# Patient Record
Sex: Male | Born: 1980 | Race: Black or African American | Hispanic: No | Marital: Single | Smoking: Current every day smoker
Health system: Southern US, Community
[De-identification: ages and names within clinical notes are randomized; demographics above are authoritative.]

---
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
## 2004-02-08 ENCOUNTER — Emergency Department (HOSPITAL_COMMUNITY): Admission: EM | Admit: 2004-02-08 | Discharge: 2004-02-08 | Payer: Self-pay | Admitting: Emergency Medicine

## 2010-06-17 ENCOUNTER — Emergency Department (HOSPITAL_COMMUNITY)
Admission: EM | Admit: 2010-06-17 | Discharge: 2010-06-18 | Disposition: A | Discharge status: Home / Self Care | Payer: Self-pay | Attending: Emergency Medicine | Admitting: Emergency Medicine

## 2010-06-17 ENCOUNTER — Emergency Department (HOSPITAL_COMMUNITY): Payer: Self-pay

## 2010-06-17 DIAGNOSIS — M549 Dorsalgia, unspecified: Secondary | ICD-10-CM | POA: Insufficient documentation

## 2011-09-22 ENCOUNTER — Emergency Department (HOSPITAL_COMMUNITY)
Admission: EM | Admit: 2011-09-22 | Discharge: 2011-09-22 | Disposition: A | Discharge status: Home / Self Care | Payer: No Typology Code available for payment source | Attending: Emergency Medicine | Admitting: Emergency Medicine

## 2011-09-22 ENCOUNTER — Encounter (HOSPITAL_COMMUNITY): Payer: Self-pay | Admitting: Emergency Medicine

## 2011-09-22 ENCOUNTER — Emergency Department (HOSPITAL_COMMUNITY): Payer: No Typology Code available for payment source

## 2011-09-22 DIAGNOSIS — IMO0001 Reserved for inherently not codable concepts without codable children: Secondary | ICD-10-CM | POA: Insufficient documentation

## 2011-09-22 DIAGNOSIS — T1490XA Injury, unspecified, initial encounter: Secondary | ICD-10-CM | POA: Insufficient documentation

## 2011-09-22 DIAGNOSIS — M545 Low back pain, unspecified: Secondary | ICD-10-CM | POA: Insufficient documentation

## 2011-09-22 DIAGNOSIS — M542 Cervicalgia: Secondary | ICD-10-CM | POA: Insufficient documentation

## 2011-09-22 MED ORDER — DIAZEPAM 5 MG PO TABS: 5.0000 mg | ORAL_TABLET | Freq: Three times a day (TID) | ORAL | Status: AC | PRN

## 2011-09-22 MED ORDER — IBUPROFEN 200 MG PO TABS
600.0000 mg | ORAL_TABLET | Freq: Once | ORAL | Status: AC
  Administered 2011-09-22: 600 mg via ORAL
  Filled 2011-09-22: qty 3

## 2011-09-22 MED ORDER — HYDROCODONE-ACETAMINOPHEN 5-325 MG PO TABS: 1.0000 | ORAL_TABLET | Freq: Four times a day (QID) | ORAL | Status: AC | PRN

## 2011-09-22 MED ORDER — IBUPROFEN 800 MG PO TABS: 800.0000 mg | ORAL_TABLET | Freq: Three times a day (TID) | ORAL | Status: AC | PRN
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
## 2011-09-22 NOTE — ED Provider Notes (Signed)
Medical screening examination/treatment/procedure(s) were performed by non-physician practitioner and as supervising physician I was immediately available for consultation/collaboration.  Ethelda Chick, MD 09/22/11 [phone]
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
## 2013-06-03 ENCOUNTER — Emergency Department (HOSPITAL_COMMUNITY): Payer: No Typology Code available for payment source

## 2013-06-03 ENCOUNTER — Encounter (HOSPITAL_COMMUNITY): Payer: Self-pay | Admitting: Emergency Medicine

## 2013-06-03 ENCOUNTER — Emergency Department (HOSPITAL_COMMUNITY)
Admission: EM | Admit: 2013-06-03 | Discharge: 2013-06-03 | Disposition: A | Discharge status: Home / Self Care | Payer: No Typology Code available for payment source | Attending: Emergency Medicine | Admitting: Emergency Medicine

## 2013-06-03 DIAGNOSIS — S335XXA Sprain of ligaments of lumbar spine, initial encounter: Secondary | ICD-10-CM | POA: Insufficient documentation

## 2013-06-03 DIAGNOSIS — S8990XA Unspecified injury of unspecified lower leg, initial encounter: Secondary | ICD-10-CM | POA: Insufficient documentation

## 2013-06-03 DIAGNOSIS — Y92410 Unspecified street and highway as the place of occurrence of the external cause: Secondary | ICD-10-CM | POA: Insufficient documentation

## 2013-06-03 DIAGNOSIS — S99919A Unspecified injury of unspecified ankle, initial encounter: Secondary | ICD-10-CM

## 2013-06-03 DIAGNOSIS — F17200 Nicotine dependence, unspecified, uncomplicated: Secondary | ICD-10-CM | POA: Insufficient documentation

## 2013-06-03 DIAGNOSIS — Y9389 Activity, other specified: Secondary | ICD-10-CM | POA: Insufficient documentation

## 2013-06-03 DIAGNOSIS — S39012A Strain of muscle, fascia and tendon of lower back, initial encounter: Secondary | ICD-10-CM

## 2013-06-03 DIAGNOSIS — S99929A Unspecified injury of unspecified foot, initial encounter: Secondary | ICD-10-CM

## 2013-06-03 MED ORDER — IBUPROFEN 600 MG PO TABS: 600.0000 mg | ORAL_TABLET | Freq: Four times a day (QID) | ORAL | Status: DC | PRN

## 2013-06-03 MED ORDER — CYCLOBENZAPRINE HCL 5 MG PO TABS: 5.0000 mg | ORAL_TABLET | Freq: Three times a day (TID) | ORAL | Status: DC | PRN
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
## 2013-06-03 NOTE — ED Provider Notes (Signed)
  Medical screening examination/treatment/procedure(s) were performed by non-physician practitioner and as supervising physician I was immediately available for consultation/collaboration.      Gerhard Munchobert Jakaylee Sasaki, MD 06/03/13 1539

## 2013-07-21 ENCOUNTER — Emergency Department (HOSPITAL_COMMUNITY)
Admission: EM | Admit: 2013-07-21 | Discharge: 2013-07-21 | Disposition: A | Discharge status: Home / Self Care | Payer: No Typology Code available for payment source | Attending: Emergency Medicine | Admitting: Emergency Medicine

## 2013-07-21 ENCOUNTER — Encounter (HOSPITAL_COMMUNITY): Payer: Self-pay | Admitting: Emergency Medicine

## 2013-07-21 DIAGNOSIS — F17200 Nicotine dependence, unspecified, uncomplicated: Secondary | ICD-10-CM | POA: Insufficient documentation

## 2013-07-21 DIAGNOSIS — M5430 Sciatica, unspecified side: Secondary | ICD-10-CM

## 2013-07-21 DIAGNOSIS — Z87828 Personal history of other (healed) physical injury and trauma: Secondary | ICD-10-CM | POA: Insufficient documentation

## 2013-07-21 MED ORDER — CYCLOBENZAPRINE HCL 10 MG PO TABS
10.0000 mg | ORAL_TABLET | Freq: Once | ORAL | Status: AC
  Administered 2013-07-21: 10 mg via ORAL
  Filled 2013-07-21: qty 1

## 2013-07-21 MED ORDER — CYCLOBENZAPRINE HCL 5 MG PO TABS: 5.0000 mg | ORAL_TABLET | Freq: Three times a day (TID) | ORAL | Status: DC | PRN

## 2013-07-21 MED ORDER — PREDNISONE 10 MG PO TABS: ORAL_TABLET | ORAL | Status: DC

## 2013-07-21 MED ORDER — PREDNISONE 50 MG PO TABS
60.0000 mg | ORAL_TABLET | Freq: Once | ORAL | Status: AC
  Administered 2013-07-21: 60 mg via ORAL
  Filled 2013-07-21 (×2): qty 1

## 2013-07-21 MED ORDER — HYDROCODONE-ACETAMINOPHEN 5-325 MG PO TABS
1.0000 | ORAL_TABLET | Freq: Once | ORAL | Status: AC
  Administered 2013-07-21: 1 via ORAL
  Filled 2013-07-21: qty 1

## 2013-07-21 MED ORDER — HYDROCODONE-ACETAMINOPHEN 5-325 MG PO TABS: 1.0000 | ORAL_TABLET | ORAL | Status: DC | PRN

## 2013-07-21 NOTE — Discharge Instructions (Signed)
Sciatica with Rehab The sciatic nerve runs from the back down the leg and is responsible for sensation and control of the muscles in the back (posterior) side of the thigh, lower leg, and foot. Sciatica is a condition that is characterized by inflammation of this nerve.  SYMPTOMS   Signs of nerve damage, including numbness and/or weakness along the posterior side of the lower extremity.  Pain in the back of the thigh that may also travel down the leg.  Pain that worsens when sitting for long periods of time.  Occasionally, pain in the back or buttock. CAUSES  Inflammation of the sciatic nerve is the cause of sciatica. The inflammation is due to something irritating the nerve. Common sources of irritation include:  Sitting for long periods of time.  Direct trauma to the nerve.  Arthritis of the spine.  Herniated or ruptured disk.  Slipping of the vertebrae (spondylolithesis)  Pressure from soft tissues, such as muscles or ligament-like tissue (fascia). RISK INCREASES WITH:  Sports that place pressure or stress on the spine (football or weightlifting).  Poor strength and flexibility.  Failure to warm-up properly before activity.  Family history of low back pain or disk disorders.  Previous back injury or surgery.  Poor body mechanics, especially when lifting, or poor posture. PREVENTION   Warm up and stretch properly before activity.  Maintain physical fitness:  Strength, flexibility, and endurance.  Cardiovascular fitness.  Learn and use proper technique, especially with posture and lifting. When possible, have coach correct improper technique.  Avoid activities that place stress on the spine. PROGNOSIS If treated properly, then sciatica usually resolves within 6 weeks. However, occasionally surgery is necessary.  RELATED COMPLICATIONS   Permanent nerve damage, including pain, numbness, tingle, or weakness.  Chronic back pain.  Risks of surgery: infection,  bleeding, nerve damage, or damage to surrounding tissues. TREATMENT Treatment initially involves resting from any activities that aggravate your symptoms. The use of ice and medication may help reduce pain and inflammation. The use of strengthening and stretching exercises may help reduce pain with activity. These exercises may be performed at home or with referral to a therapist. A therapist may recommend further treatments, such as transcutaneous electronic nerve stimulation (TENS) or ultrasound. Your caregiver may recommend corticosteroid injections to help reduce inflammation of the sciatic nerve. If symptoms persist despite non-surgical (conservative) treatment, then surgery may be recommended. MEDICATION  If pain medication is necessary, then nonsteroidal anti-inflammatory medications, such as aspirin and ibuprofen, or other minor pain relievers, such as acetaminophen, are often recommended.  Do not take pain medication for 7 days before surgery.  Prescription pain relievers may be given if deemed necessary by your caregiver. Use only as directed and only as much as you need.  Ointments applied to the skin may be helpful.  Corticosteroid injections may be given by your caregiver. These injections should be reserved for the most serious cases, because they may only be given a certain number of times. HEAT AND COLD  Cold treatment (icing) relieves pain and reduces inflammation. Cold treatment should be applied for 10 to 15 minutes every 2 to 3 hours for inflammation and pain and immediately after any activity that aggravates your symptoms. Use ice packs or massage the area with a piece of ice (ice massage).  Heat treatment may be used prior to performing the stretching and strengthening activities prescribed by your caregiver, physical therapist, or athletic trainer. Use a heat pack or soak the injury in warm water. SEEK  MEDICAL CARE IF:  Treatment seems to offer no benefit, or the condition  worsens.  Any medications produce adverse side effects. EXERCISES  RANGE OF MOTION (ROM) AND STRETCHING EXERCISES - Sciatica Most people with sciatic will find that their symptoms worsen with either excessive bending forward (flexion) or arching at the low back (extension). The exercises which will help resolve your symptoms will focus on the opposite motion. Your physician, physical therapist or athletic trainer will help you determine which exercises will be most helpful to resolve your low back pain. Do not complete any exercises without first consulting with your clinician. Discontinue any exercises which worsen your symptoms until you speak to your clinician. If you have pain, numbness or tingling which travels down into your buttocks, leg or foot, the goal of the therapy is for these symptoms to move closer to your back and eventually resolve. Occasionally, these leg symptoms will get better, but your low back pain may worsen; this is typically an indication of progress in your rehabilitation. Be certain to be very alert to any changes in your symptoms and the activities in which you participated in the 24 hours prior to the change. Sharing this information with your clinician will allow him/her to most efficiently treat your condition. These exercises may help you when beginning to rehabilitate your injury. Your symptoms may resolve with or without further involvement from your physician, physical therapist or athletic trainer. While completing these exercises, remember:   Restoring tissue flexibility helps normal motion to return to the joints. This allows healthier, less painful movement and activity.  An effective stretch should be held for at least 30 seconds.  A stretch should never be painful. You should only feel a gentle lengthening or release in the stretched tissue. FLEXION RANGE OF MOTION AND STRETCHING EXERCISES: STRETCH  Flexion, Single Knee to Chest   Lie on a firm bed or floor  with both legs extended in front of you.  Keeping one leg in contact with the floor, bring your opposite knee to your chest. Hold your leg in place by either grabbing behind your thigh or at your knee.  Pull until you feel a gentle stretch in your low back. Hold __________ seconds.  Slowly release your grasp and repeat the exercise with the opposite side. Repeat __________ times. Complete this exercise __________ times per day.  STRETCH  Flexion, Double Knee to Chest  Lie on a firm bed or floor with both legs extended in front of you.  Keeping one leg in contact with the floor, bring your opposite knee to your chest.  Tense your stomach muscles to support your back and then lift your other knee to your chest. Hold your legs in place by either grabbing behind your thighs or at your knees.  Pull both knees toward your chest until you feel a gentle stretch in your low back. Hold __________ seconds.  Tense your stomach muscles and slowly return one leg at a time to the floor. Repeat __________ times. Complete this exercise __________ times per day.  STRETCH  Low Trunk Rotation   Lie on a firm bed or floor. Keeping your legs in front of you, bend your knees so they are both pointed toward the ceiling and your feet are flat on the floor.  Extend your arms out to the side. This will stabilize your upper body by keeping your shoulders in contact with the floor.  Gently and slowly drop both knees together to one side until you  feel a gentle stretch in your low back. Hold for __________ seconds.  Tense your stomach muscles to support your low back as you bring your knees back to the starting position. Repeat the exercise to the other side. Repeat __________ times. Complete this exercise __________ times per day  EXTENSION RANGE OF MOTION AND FLEXIBILITY EXERCISES: STRETCH  Extension, Prone on Elbows  Lie on your stomach on the floor, a bed will be too soft. Place your palms about shoulder  width apart and at the height of your head.  Place your elbows under your shoulders. If this is too painful, stack pillows under your chest.  Allow your body to relax so that your hips drop lower and make contact more completely with the floor.  Hold this position for __________ seconds.  Slowly return to lying flat on the floor. Repeat __________ times. Complete this exercise __________ times per day.  RANGE OF MOTION  Extension, Prone Press Ups  Lie on your stomach on the floor, a bed will be too soft. Place your palms about shoulder width apart and at the height of your head.  Keeping your back as relaxed as possible, slowly straighten your elbows while keeping your hips on the floor. You may adjust the placement of your hands to maximize your comfort. As you gain motion, your hands will come more underneath your shoulders.  Hold this position __________ seconds.  Slowly return to lying flat on the floor. Repeat __________ times. Complete this exercise __________ times per day.  STRENGTHENING EXERCISES - Sciatica  These exercises may help you when beginning to rehabilitate your injury. These exercises should be done near your "sweet spot." This is the neutral, low-back arch, somewhere between fully rounded and fully arched, that is your least painful position. When performed in this safe range of motion, these exercises can be used for people who have either a flexion or extension based injury. These exercises may resolve your symptoms with or without further involvement from your physician, physical therapist or athletic trainer. While completing these exercises, remember:   Muscles can gain both the endurance and the strength needed for everyday activities through controlled exercises.  Complete these exercises as instructed by your physician, physical therapist or athletic trainer. Progress with the resistance and repetition exercises only as your caregiver advises.  You may  experience muscle soreness or fatigue, but the pain or discomfort you are trying to eliminate should never worsen during these exercises. If this pain does worsen, stop and make certain you are following the directions exactly. If the pain is still present after adjustments, discontinue the exercise until you can discuss the trouble with your clinician. STRENGTHENING Deep Abdominals, Pelvic Tilt   Lie on a firm bed or floor. Keeping your legs in front of you, bend your knees so they are both pointed toward the ceiling and your feet are flat on the floor.  Tense your lower abdominal muscles to press your low back into the floor. This motion will rotate your pelvis so that your tail bone is scooping upwards rather than pointing at your feet or into the floor.  With a gentle tension and even breathing, hold this position for __________ seconds. Repeat __________ times. Complete this exercise __________ times per day.  STRENGTHENING  Abdominals, Crunches   Lie on a firm bed or floor. Keeping your legs in front of you, bend your knees so they are both pointed toward the ceiling and your feet are flat on the floor. [employer]  your arms over your chest.  Slightly tip your chin down without bending your neck.  Tense your abdominals and slowly lift your trunk high enough to just clear your shoulder blades. Lifting higher can put excessive stress on the low back and does not further strengthen your abdominal muscles.  Control your return to the starting position. Repeat __________ times. Complete this exercise __________ times per day.  STRENGTHENING  Quadruped, Opposite UE/LE Lift  Assume a hands and knees position on a firm surface. Keep your hands under your shoulders and your knees under your hips. You may place padding under your knees for comfort.  Find your neutral spine and gently tense your abdominal muscles so that you can maintain this position. Your shoulders and hips should form a rectangle that  is parallel with the floor and is not twisted.  Keeping your trunk steady, lift your right hand no higher than your shoulder and then your left leg no higher than your hip. Make sure you are not holding your breath. Hold this position __________ seconds.  Continuing to keep your abdominal muscles tense and your back steady, slowly return to your starting position. Repeat with the opposite arm and leg. Repeat __________ times. Complete this exercise __________ times per day.  STRENGTHENING  Abdominals and Quadriceps, Straight Leg Raise   Lie on a firm bed or floor with both legs extended in front of you.  Keeping one leg in contact with the floor, bend the other knee so that your foot can rest flat on the floor.  Find your neutral spine, and tense your abdominal muscles to maintain your spinal position throughout the exercise.  Slowly lift your straight leg off the floor about 6 inches for a count of 15, making sure to not hold your breath.  Still keeping your neutral spine, slowly lower your leg all the way to the floor. Repeat this exercise with each leg __________ times. Complete this exercise __________ times per day. POSTURE AND BODY MECHANICS CONSIDERATIONS - Sciatica Keeping correct posture when sitting, standing or completing your activities will reduce the stress put on different body tissues, allowing injured tissues a chance to heal and limiting painful experiences. The following are general guidelines for improved posture. Your physician or physical therapist will provide you with any instructions specific to your needs. While reading these guidelines, remember:  The exercises prescribed by your provider will help you have the flexibility and strength to maintain correct postures.  The correct posture provides the optimal environment for your joints to work. All of your joints have less wear and tear when properly supported by a spine with good posture. This means you will  experience a healthier, less painful body.  Correct posture must be practiced with all of your activities, especially prolonged sitting and standing. Correct posture is as important when doing repetitive low-stress activities (typing) as it is when doing a single heavy-load activity (lifting). RESTING POSITIONS Consider which positions are most painful for you when choosing a resting position. If you have pain with flexion-based activities (sitting, bending, stooping, squatting), choose a position that allows you to rest in a less flexed posture. You would want to avoid curling into a fetal position on your side. If your pain worsens with extension-based activities (prolonged standing, working overhead), avoid resting in an extended position such as sleeping on your stomach. Most people will find more comfort when they rest with their spine in a more neutral position, neither too rounded nor too arched. Lying  on a non-sagging bed on your side with a pillow between your knees, or on your back with a pillow under your knees will often provide some relief. Keep in mind, being in any one position for a prolonged period of time, no matter how correct your posture, can still lead to stiffness. PROPER SITTING POSTURE In order to minimize stress and discomfort on your spine, you must sit with correct posture Sitting with good posture should be effortless for a healthy body. Returning to good posture is a gradual process. Many people can work toward this most comfortably by using various supports until they have the flexibility and strength to maintain this posture on their own. When sitting with proper posture, your ears will fall over your shoulders and your shoulders will fall over your hips. You should use the back of the chair to support your upper back. Your low back will be in a neutral position, just slightly arched. You may place a small pillow or folded towel at the base of your low back for support.  When  working at a desk, create an environment that supports good, upright posture. Without extra support, muscles fatigue and lead to excessive strain on joints and other tissues. Keep these recommendations in mind: CHAIR:   A chair should be able to slide under your desk when your back makes contact with the back of the chair. This allows you to work closely.  The chair's height should allow your eyes to be level with the upper part of your monitor and your hands to be slightly lower than your elbows. BODY POSITION  Your feet should make contact with the floor. If this is not possible, use a foot rest.  Keep your ears over your shoulders. This will reduce stress on your neck and low back. INCORRECT SITTING POSTURES   If you are feeling tired and unable to assume a healthy sitting posture, do not slouch or slump. This puts excessive strain on your back tissues, causing more damage and pain. Healthier options include:  Using more support, like a lumbar pillow.  Switching tasks to something that requires you to be upright or walking.  Talking a brief walk.  Lying down to rest in a neutral-spine position. PROLONGED STANDING WHILE SLIGHTLY LEANING FORWARD  When completing a task that requires you to lean forward while standing in one place for a long time, place either foot up on a stationary 2-4 inch high object to help maintain the best posture. When both feet are on the ground, the low back tends to lose its slight inward curve. If this curve flattens (or becomes too large), then the back and your other joints will experience too much stress, fatigue more quickly and can cause pain.  CORRECT STANDING POSTURES Proper standing posture should be assumed with all daily activities, even if they only take a few moments, like when brushing your teeth. As in sitting, your ears should fall over your shoulders and your shoulders should fall over your hips. You should keep a slight tension in your abdominal  muscles to brace your spine. Your tailbone should point down to the ground, not behind your body, resulting in an over-extended swayback posture.  INCORRECT STANDING POSTURES  Common incorrect standing postures include a forward head, locked knees and/or an excessive swayback. WALKING Walk with an upright posture. Your ears, shoulders and hips should all line-up. PROLONGED ACTIVITY IN A FLEXED POSITION When completing a task that requires you to bend forward at your  waist or lean over a low surface, try to find a way to stabilize 3 of 4 of your limbs. You can place a hand or elbow on your thigh or rest a knee on the surface you are reaching across. This will provide you more stability so that your muscles do not fatigue as quickly. By keeping your knees relaxed, or slightly bent, you will also reduce stress across your low back. CORRECT LIFTING TECHNIQUES DO :   Assume a wide stance. This will provide you more stability and the opportunity to get as close as possible to the object which you are lifting.  Tense your abdominals to brace your spine; then bend at the knees and hips. Keeping your back locked in a neutral-spine position, lift using your leg muscles. Lift with your legs, keeping your back straight.  Test the weight of unknown objects before attempting to lift them.  Try to keep your elbows locked down at your sides in order get the best strength from your shoulders when carrying an object.  Always ask for help when lifting heavy or awkward objects. INCORRECT LIFTING TECHNIQUES DO NOT:   Lock your knees when lifting, even if it is a small object.  Bend and twist. Pivot at your feet or move your feet when needing to change directions.  Assume that you cannot safely pick up a paperclip without proper posture. Document Released: 04/20/2005 Document Revised: 07/13/2011 Document Reviewed: 08/02/2008 [facility] Patient Information 2014 [place], [place].   Take your next dose of  prednisone tomorrow morning.  Use the the other medicines as directed.  Do not drive within 4 hours of taking hydrocodone as this will make you drowsy.  Avoid lifting,  Bending,  Twisting or any other activity that worsens your pain over the next week.   You should get rechecked if your symptoms are not better over the next 5 days,  Or you develop increased pain,  Weakness in your leg(s) or loss of bladder or bowel function - these are symptoms of a worse injury.
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
## 2013-07-26 NOTE — ED Provider Notes (Signed)
Medical screening examination/treatment/procedure(s) were performed by non-physician practitioner and as supervising physician I was immediately available for consultation/collaboration.   EKG Interpretation None        Christopher J. Pollina, MD 07/26/13 0701 

## 2021-09-02 ENCOUNTER — Emergency Department (HOSPITAL_COMMUNITY): Payer: 59

## 2021-09-02 ENCOUNTER — Encounter (HOSPITAL_COMMUNITY): Payer: Self-pay | Admitting: Emergency Medicine

## 2021-09-02 ENCOUNTER — Other Ambulatory Visit: Payer: Self-pay

## 2021-09-02 ENCOUNTER — Emergency Department (HOSPITAL_COMMUNITY)
Admission: EM | Admit: 2021-09-02 | Discharge: 2021-09-02 | Disposition: A | Discharge status: Home / Self Care | Payer: 59 | Attending: Emergency Medicine | Admitting: Emergency Medicine

## 2021-09-02 DIAGNOSIS — M5440 Lumbago with sciatica, unspecified side: Secondary | ICD-10-CM | POA: Insufficient documentation

## 2021-09-02 DIAGNOSIS — M545 Low back pain, unspecified: Secondary | ICD-10-CM | POA: Diagnosis present

## 2021-09-02 MED ORDER — IBUPROFEN 800 MG PO TABS
800.0000 mg | ORAL_TABLET | Freq: Once | ORAL | Status: AC
  Administered 2021-09-02: 800 mg via ORAL
  Filled 2021-09-02: qty 1

## 2021-09-02 MED ORDER — IBUPROFEN 800 MG PO TABS: 800.0000 mg | ORAL_TABLET | Freq: Three times a day (TID) | ORAL | 1 refills | Status: DC | PRN

## 2021-09-02 NOTE — ED Notes (Signed)
Patient transported to X-ray 

## 2021-09-02 NOTE — ED Triage Notes (Signed)
Pt c/o lower right back pain that radiates down right leg since Sunday.  ?

## 2021-09-02 NOTE — Discharge Instructions (Signed)
Follow-up with Dr. Romeo Apple for your back.  Also get a family doctor.  Make sure your blood pressure check next couple weeks.  Try not to do any lifting over 10 pounds ?

## 2021-09-02 NOTE — ED Provider Notes (Signed)
?[facility] EMERGENCY DEPARTMENT ?Provider Note ? ? ?CSN: [id number] ?Arrival date & time: 09/02/21  0631 ? ?  ? ?History ? ?Chief Complaint  ?Patient presents with  ? Back Pain  ? ? ?Harry Woodard is a 41 y.o. male. ? ?Patient has no past medical history.  Patient complains of low back pain rating down her right leg. ? ?The history is provided by the patient and medical records. No language interpreter was used.  ?Back Pain ?Location:  Lumbar spine ?Quality:  Aching ?Radiates to:  R posterior upper leg ?Pain severity:  Moderate ?Onset quality:  Sudden ?Timing:  Constant ?Progression:  Worsening ?Chronicity:  New ?Context: not emotional stress   ?Relieved by:  Nothing ?Worsened by:  Nothing ?Ineffective treatments:  None tried ?Associated symptoms: no abdominal pain, no chest pain and no headaches   ? ?  ? ?Home Medications ?Prior to Admission medications   ?Medication Sig Start Date End Date Taking? Authorizing Provider  ?ibuprofen (ADVIL) 800 MG tablet Take 1 tablet (800 mg total) by mouth every 8 (eight) hours as needed for moderate pain. 09/02/21  Yes Bethann Berkshire, MD  ?cyclobenzaprine (FLEXERIL) 5 MG tablet Take 1 tablet (5 mg total) by mouth 3 (three) times daily as needed for muscle spasms. 06/03/13   Burgess Amor, PA-C  ?cyclobenzaprine (FLEXERIL) 5 MG tablet Take 1 tablet (5 mg total) by mouth 3 (three) times daily as needed for muscle spasms. 07/21/13   Burgess Amor, PA-C  ?HYDROcodone-acetaminophen (NORCO/VICODIN) 5-325 MG per tablet Take 1 tablet by mouth every 4 (four) hours as needed for moderate pain. 07/21/13   Burgess Amor, PA-C  ?predniSONE (DELTASONE) 10 MG tablet 6, 5, 4, 3, 2 then 1 tablet by mouth daily for 6 days total. 07/21/13   Burgess Amor, PA-C  ?   ? ?Allergies    ?Patient has no known allergies.   ? ?Review of Systems   ?Review of Systems  ?Constitutional:  Negative for appetite change and fatigue.  ?HENT:  Negative for congestion, ear discharge and sinus pressure.   ?Eyes:  Negative for  discharge.  ?Respiratory:  Negative for cough.   ?Cardiovascular:  Negative for chest pain.  ?Gastrointestinal:  Negative for abdominal pain and diarrhea.  ?Genitourinary:  Negative for frequency and hematuria.  ?Musculoskeletal:  Positive for back pain.  ?Skin:  Negative for rash.  ?Neurological:  Negative for seizures and headaches.  ?Psychiatric/Behavioral:  Negative for hallucinations.   ? ?Physical Exam ?Updated Vital Signs ?BP (!) 131/117   Pulse 62   Temp 97.9 ?F (36.6 ?C) (Oral)   Resp 16   Ht 5\' 6"  (1.676 m)   Wt 68 kg   SpO2 97%   BMI 24.21 kg/m?  ?Physical Exam ?Vitals and nursing note reviewed.  ?Constitutional:   ?   Appearance: He is well-developed.  ?HENT:  ?   Head: Normocephalic.  ?   Nose: Nose normal.  ?Eyes:  ?   General: No scleral icterus. ?   Conjunctiva/sclera: Conjunctivae normal.  ?Neck:  ?   Thyroid: No thyromegaly.  ?Cardiovascular:  ?   Rate and Rhythm: Normal rate and regular rhythm.  ?   Heart sounds: No murmur heard. ?  No friction rub. No gallop.  ?Pulmonary:  ?   Breath sounds: No stridor. No wheezing or rales.  ?Chest:  ?   Chest wall: No tenderness.  ?Abdominal:  ?   General: There is no distension.  ?   Tenderness: There is no abdominal tenderness.  There is no rebound.  ?Musculoskeletal:  ?   Cervical back: Neck supple.  ?   Comments: Tender lumbar spine  ?Lymphadenopathy:  ?   Cervical: No cervical adenopathy.  ?Skin: ?   Findings: No erythema or rash.  ?Neurological:  ?   Mental Status: He is alert and oriented to person, place, and time.  ?   Motor: No abnormal muscle tone.  ?   Coordination: Coordination normal.  ?Psychiatric:     ?   Behavior: Behavior normal.  ? ? ?ED Results / Procedures / Treatments   ?Labs ?(all labs ordered are listed, but only abnormal results are displayed) ?Labs Reviewed - No data to display ? ?EKG ?None ? ?Radiology ?DG Lumbar Spine Complete ? ?Result Date: 09/02/2021 ?CLINICAL DATA:  Right lower back pain radiating into the right leg since  Sunday EXAM: LUMBAR SPINE - COMPLETE 4+ VIEW COMPARISON:  06/03/2013 FINDINGS: There are 6 lumbar type vertebral bodies. There is pseudoarticulation between the right transverse process of L6 and the right sacral ala. No significant listhesis. Mild disc height loss and endplate spurring present at L6-S1, similar to prior study. Mild facet degenerative changes present at L5-L6 and L6-S1. 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 Electronically Signed   By: Acquanetta Belling M.D.   On: 09/02/2021 07:58   ? ?Procedures ?Procedures  ? ? ?Medications Ordered in ED ?Medications  ?ibuprofen (ADVIL) tablet 800 mg (800 mg Oral Given 09/02/21 0736)  ? ? ?ED Course/ Medical Decision Making/ A&P ?  ?                        ?Medical Decision Making ?Amount and/or Complexity of Data Reviewed ?Radiology: ordered. ? ?Risk ?Prescription drug management. ? ? ?This patient presents to the ED for concern of back pain, this involves an extensive number of treatment options, and is a complaint that carries with it a high risk of complications and morbidity.  The differential diagnosis includes muscle strain, disc disease ? ? ?Co morbidities that complicate the patient evaluation ? ?None ? ? ?Additional history obtained: ? ?Additional history obtained from patient ?External records from outside source obtained and reviewed including hospital record ? ? ?Lab Tests: ?No labs ?Imaging Studies ordered: ? ?I ordered imaging studies including lumbar spine series ?I independently visualized and interpreted imaging which showed degenerative changes ?I agree with the radiologist interpretation ? ? ?Cardiac Monitoring: / EKG: ? ?The patient was maintained on a cardiac monitor.  I personally viewed and interpreted the cardiac monitored which showed an underlying rhythm of: Normal sinus rhythm ? ? ?Consultations Obtained: ? ?No consult ? ?Problem List / ED Course / Critical interventions / Medication  management ? ?Back pain and ?I ordered medication including Motrin for pain ?Reevaluation of the patient after these medicines showed that the patient stayed the same ?I have reviewed the patients home medicines and have made adjustments as needed ? ? ?Social Determinants of Health: ? ?None ? ? ?Test / Admission - Considered: ? ?None ? ?Patient with lumbar strain and sciatica.  He also has mildly elevated blood pressure.  He is placed on Motrin for his back and will follow-up with the PCP and orthopedics.  He was instructed to get his blood pressure checked a couple weeks ? ? ? ? ? ? ? ?Final Clinical Impression(s) / ED Diagnoses ?Final diagnoses:  ?Acute right-sided low back pain with sciatica, sciatica laterality unspecified  ? ? ?  Rx / DC Orders ?ED Discharge Orders   ? ?      Ordered  ?  ibuprofen (ADVIL) 800 MG tablet  Every 8 hours PRN       ? 09/02/21 [id number]  ? ?  ?  ? ?  ? ? ?  ?Bethann BerkshireZammit, Von [place], MD ?09/02/21 1658 ? ?

## 2021-09-10 ENCOUNTER — Ambulatory Visit: Payer: 59 | Admitting: Family Medicine

## 2022-04-23 ENCOUNTER — Emergency Department (HOSPITAL_COMMUNITY)
Admission: EM | Admit: 2022-04-23 | Discharge: 2022-04-23 | Disposition: A | Discharge status: Home / Self Care | Payer: No Typology Code available for payment source | Attending: Emergency Medicine | Admitting: Emergency Medicine

## 2022-04-23 ENCOUNTER — Encounter (HOSPITAL_COMMUNITY): Payer: Self-pay | Admitting: *Deleted

## 2022-04-23 ENCOUNTER — Other Ambulatory Visit: Payer: Self-pay

## 2022-04-23 ENCOUNTER — Emergency Department (HOSPITAL_COMMUNITY): Payer: No Typology Code available for payment source

## 2022-04-23 DIAGNOSIS — Y92410 Unspecified street and highway as the place of occurrence of the external cause: Secondary | ICD-10-CM | POA: Diagnosis not present

## 2022-04-23 DIAGNOSIS — S39012A Strain of muscle, fascia and tendon of lower back, initial encounter: Secondary | ICD-10-CM | POA: Diagnosis not present

## 2022-04-23 DIAGNOSIS — S0990XA Unspecified injury of head, initial encounter: Secondary | ICD-10-CM | POA: Insufficient documentation

## 2022-04-23 DIAGNOSIS — S161XXA Strain of muscle, fascia and tendon at neck level, initial encounter: Secondary | ICD-10-CM | POA: Insufficient documentation

## 2022-04-23 DIAGNOSIS — S199XXA Unspecified injury of neck, initial encounter: Secondary | ICD-10-CM | POA: Diagnosis present

## 2022-04-23 MED ORDER — IBUPROFEN 600 MG PO TABS: 600.0000 mg | ORAL_TABLET | Freq: Three times a day (TID) | ORAL | 0 refills | Status: DC
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
## 2022-09-09 ENCOUNTER — Emergency Department (HOSPITAL_COMMUNITY): Payer: No Typology Code available for payment source

## 2022-09-09 ENCOUNTER — Encounter (HOSPITAL_COMMUNITY): Payer: Self-pay

## 2022-09-09 ENCOUNTER — Emergency Department (HOSPITAL_COMMUNITY)
Admission: EM | Admit: 2022-09-09 | Discharge: 2022-09-10 | Disposition: A | Discharge status: Home / Self Care | Payer: No Typology Code available for payment source | Attending: Emergency Medicine | Admitting: Emergency Medicine

## 2022-09-09 ENCOUNTER — Other Ambulatory Visit: Payer: Self-pay

## 2022-09-09 DIAGNOSIS — Y92410 Unspecified street and highway as the place of occurrence of the external cause: Secondary | ICD-10-CM | POA: Diagnosis not present

## 2022-09-09 DIAGNOSIS — S39012A Strain of muscle, fascia and tendon of lower back, initial encounter: Secondary | ICD-10-CM | POA: Diagnosis not present

## 2022-09-09 DIAGNOSIS — S63501A Unspecified sprain of right wrist, initial encounter: Secondary | ICD-10-CM | POA: Diagnosis not present

## 2022-09-09 DIAGNOSIS — M25531 Pain in right wrist: Secondary | ICD-10-CM | POA: Diagnosis present

## 2022-09-09 MED ORDER — CYCLOBENZAPRINE HCL 10 MG PO TABS: 10.0000 mg | ORAL_TABLET | Freq: Three times a day (TID) | ORAL | 0 refills | Status: AC | PRN

## 2022-09-09 MED ORDER — IBUPROFEN 800 MG PO TABS: 800.0000 mg | ORAL_TABLET | Freq: Four times a day (QID) | ORAL | 0 refills | Status: AC | PRN
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
## 2024-01-16 IMAGING — DX DG LUMBAR SPINE COMPLETE 4+V
5 series · 5 of 5 positions shown · non-contrast
Comparison: 06/03/2013

CLINICAL DATA: Right lower back pain radiating into the right leg
since [REDACTED]

EXAM:
LUMBAR SPINE - COMPLETE 4+ VIEW

[l-spine ap]
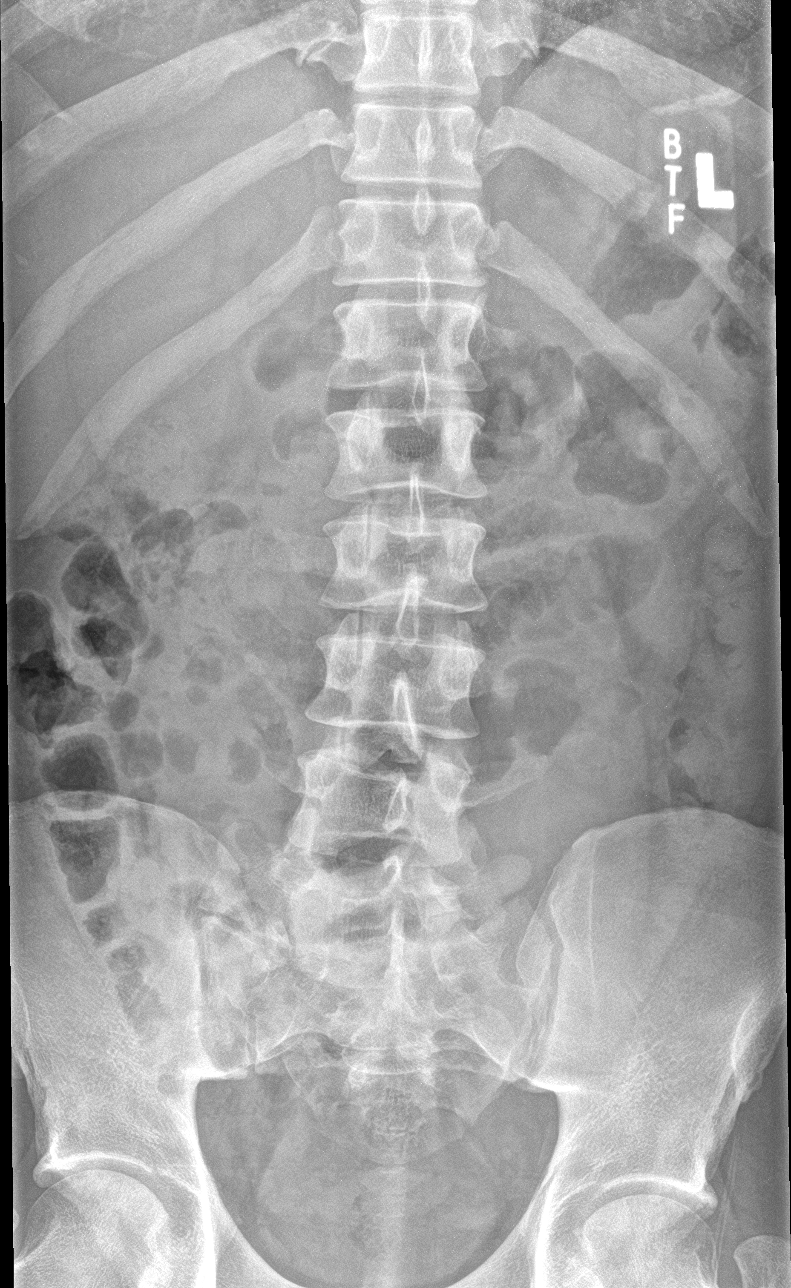

[l-spine obl (1 of 2)]
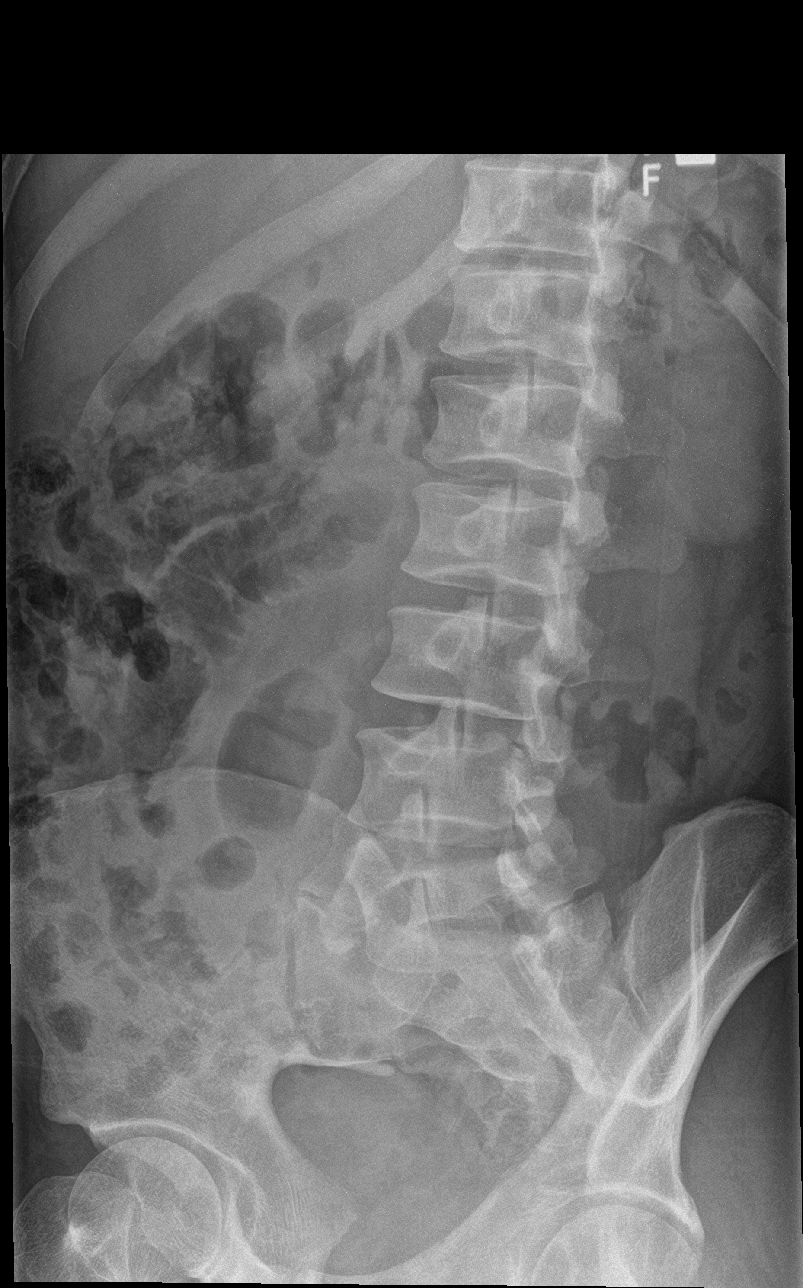

[l-spine obl (2 of 2)]
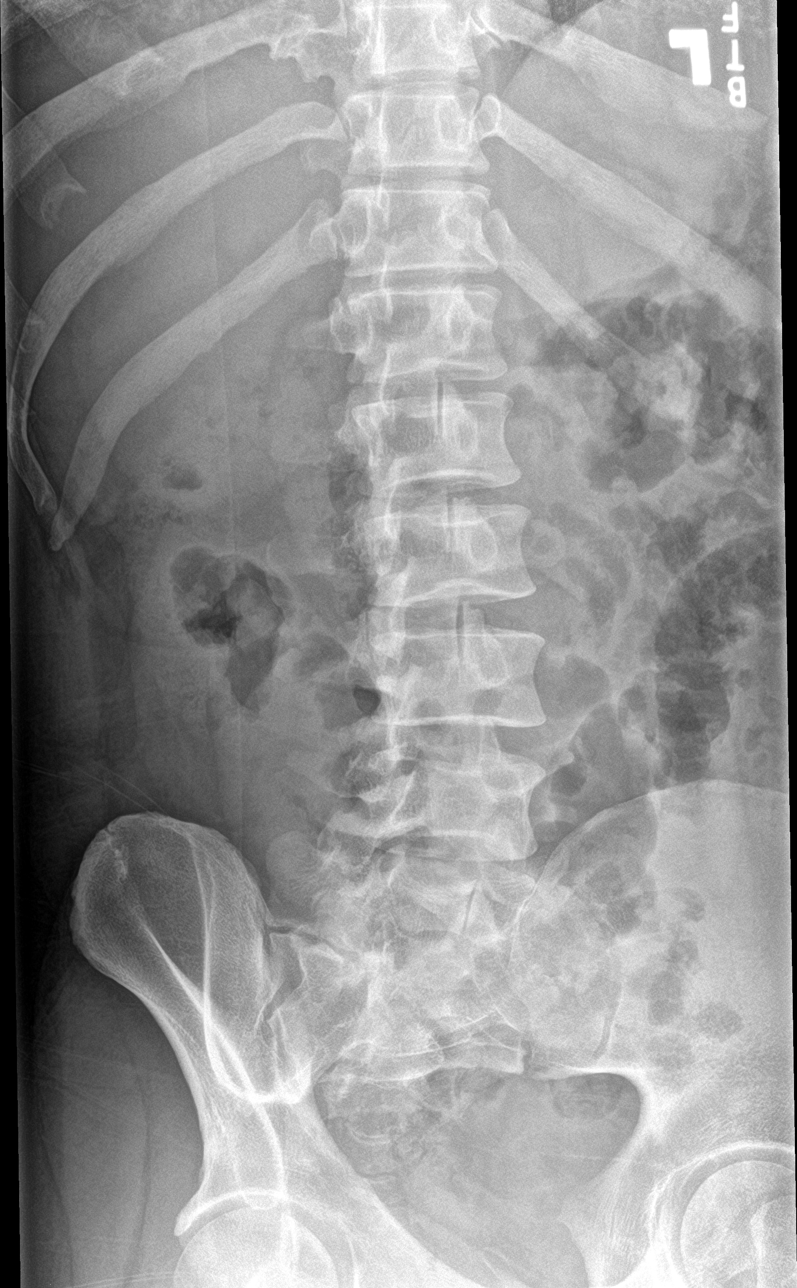

[l-spine lat]
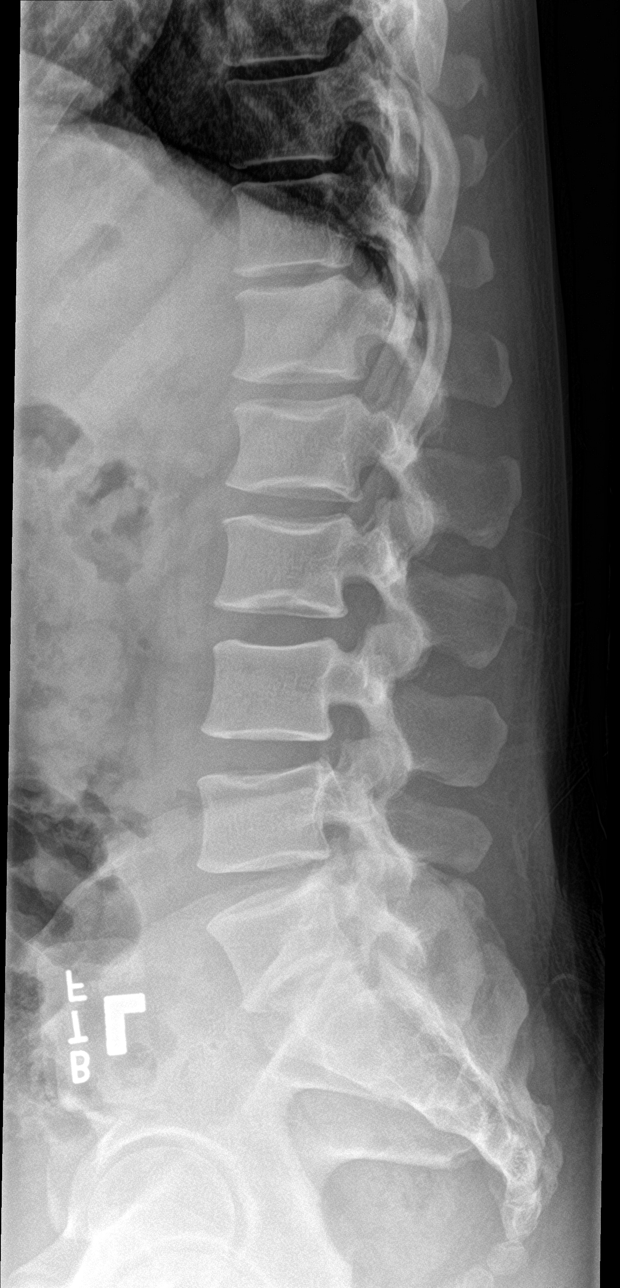

[l-spine spot]
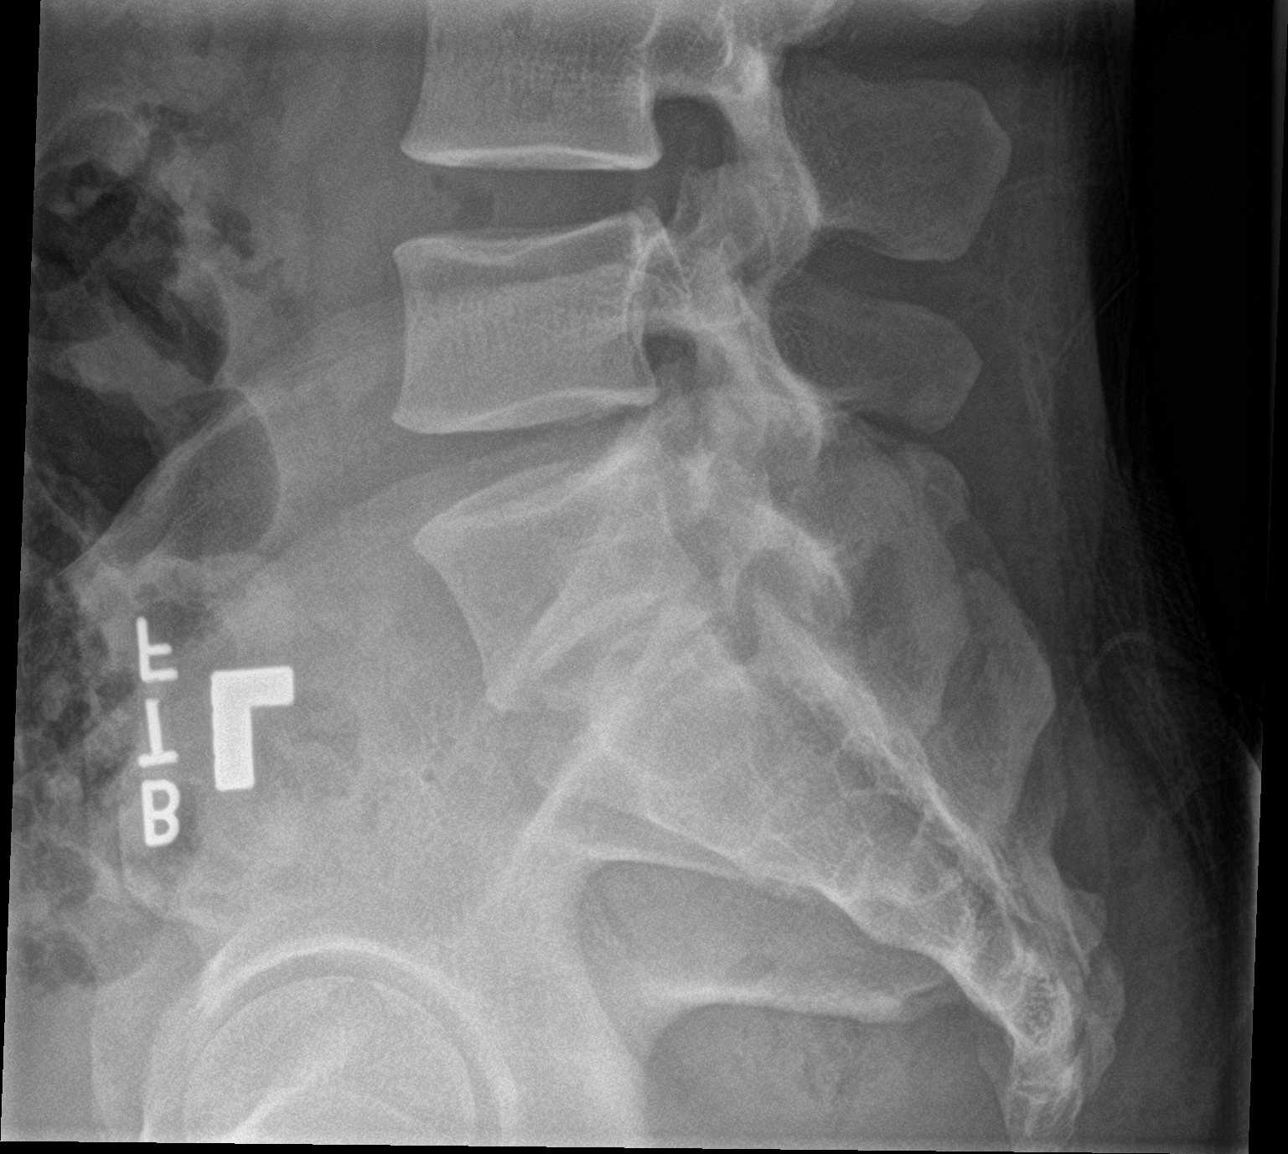

[5 of 5 positions shown; findings below may reference images not displayed]

FINDINGS: There are 6 lumbar type vertebral bodies.

There is pseudoarticulation between the right transverse process of
L6 and the right sacral ala. No significant listhesis.

Mild disc height loss and endplate spurring present at L6-S1,
similar to prior study. Mild facet degenerative changes present at
L5-L6 and L6-S1.
IMPRESSION: Mild degenerative changes of the lower lumbar spine, similar to
prior study.
